# Patient Record
Sex: Female | Born: 1950 | Race: White | Hispanic: No | Marital: Married | Smoking: Never smoker
Health system: Southern US, Community
[De-identification: ages and names within clinical notes are randomized; demographics above are authoritative.]

## PROBLEM LIST (undated history)

## (undated) DIAGNOSIS — E785 Hyperlipidemia, unspecified: Secondary | ICD-10-CM

## (undated) DIAGNOSIS — E039 Hypothyroidism, unspecified: Secondary | ICD-10-CM

## (undated) HISTORY — DX: Hyperlipidemia, unspecified: E78.5

## (undated) HISTORY — PX: APPENDECTOMY: SHX54

## (undated) HISTORY — DX: Hypothyroidism, unspecified: E03.9

## (undated) HISTORY — PX: ABDOMINAL HYSTERECTOMY: SHX81

---

## 1984-10-31 HISTORY — PX: TMJ ARTHROPLASTY: SHX1066

## 1998-10-31 HISTORY — PX: CYSTOCELE REPAIR: SHX163

## 2011-07-15 ENCOUNTER — Ambulatory Visit (INDEPENDENT_AMBULATORY_CARE_PROVIDER_SITE_OTHER): Payer: BC Managed Care – PPO | Admitting: Internal Medicine

## 2011-07-15 ENCOUNTER — Encounter: Payer: Self-pay | Admitting: Internal Medicine

## 2011-07-15 DIAGNOSIS — E039 Hypothyroidism, unspecified: Secondary | ICD-10-CM | POA: Insufficient documentation

## 2011-07-15 DIAGNOSIS — E785 Hyperlipidemia, unspecified: Secondary | ICD-10-CM | POA: Insufficient documentation

## 2011-07-15 LAB — CBC WITH DIFFERENTIAL/PLATELET
Basophils Absolute: 0 10*3/uL (ref 0.0–0.1)
Eosinophils Absolute: 0.1 10*3/uL (ref 0.0–0.7)
Lymphocytes Relative: 43.2 % (ref 12.0–46.0)
MCHC: 32.6 g/dL (ref 30.0–36.0)
Monocytes Relative: 6.7 % (ref 3.0–12.0)
Neutro Abs: 3.1 10*3/uL (ref 1.4–7.7)
Platelets: 223 10*3/uL (ref 150.0–400.0)
RDW: 16.6 % — ABNORMAL HIGH (ref 11.5–14.6)

## 2011-07-15 LAB — LDL CHOLESTEROL, DIRECT: Direct LDL: 161 mg/dL

## 2011-07-15 LAB — LIPID PANEL
Cholesterol: 235 mg/dL — ABNORMAL HIGH (ref 0–200)
HDL: 52.8 mg/dL (ref >39.00)
Total CHOL/HDL Ratio: 4
Triglycerides: 165 mg/dL — ABNORMAL HIGH (ref 0.0–149.0)
VLDL: 33 mg/dL (ref 0.0–40.0)

## 2011-07-15 LAB — BASIC METABOLIC PANEL
CO2: 23 mEq/L (ref 19–32)
Calcium: 9.2 mg/dL (ref 8.4–10.5)
Chloride: 106 mEq/L (ref 96–112)
Creatinine, Ser: 0.9 mg/dL (ref 0.4–1.2)
Sodium: 139 mEq/L (ref 135–145)

## 2011-07-15 LAB — TSH: TSH: 11.54 u[IU]/mL — ABNORMAL HIGH (ref 0.35–5.50)

## 2011-07-15 NOTE — Assessment & Plan Note (Signed)
She took statins at some point in the past, no side effect but according to the patient they didn't work. Extensive discussion about diet and exercise. Labs

## 2011-07-15 NOTE — Progress Notes (Signed)
  Subjective:    Patient ID: Jodi Lynch, female    DOB: 06-22-1951, 60 y.o.   MRN: [id number]  HPI New patient, here to get established. Her husband is one of my new patients as well ,Jodi  Lynch. In general doing well  Past Medical History  Diagnosis Date  . Hyperlipidemia     used to be on a statin   . Hypothyroid    Past Surgical History  Procedure Date  . Tmj arthroplasty 1986  . Cystocele repair 2000  . Abdominal hysterectomy     no oophorectomy  . Appendectomy    History   Social History  . Marital Status: Married    Spouse Name: N/A    Number of Children: N/A  . Years of Education: N/A   Occupational History  . 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 Smoking status: Never Smoker   . Smokeless tobacco: Never Used  . Alcohol Use: No  . Drug Use: No  . Sexually Active: Not on file   Other Topics Concern  . Not on file   Social History Narrative   The patient is married. Moved to [facility] 2011. Her children still live up [place]. 2 children, 2 children from her current husband, 90 G-children    Family History  Problem Relation Age of Onset  . Cancer Mother     breast  . Arthritis Father   . Cancer Father     lung  . Hyperlipidemia Father   . Hypertension Father   . Diabetes Maternal Aunt   . Stroke Maternal Grandfather   . Hypertension Maternal Grandfather     Review of Systems Complain of mild fatigue, she believes is due to her weight. 2 years ago she did a low carbohydrate diet and lost 23 pounds, has not gained them back but is unable to keep losing. Denies any chest pain or lower extremity edema occ short of breath when she goes up the stairs, this symptom has been stable over time.     Objective:   Physical Exam  Constitutional: She is oriented to person, place, and time. She appears well-developed.       Overweight appearing  HENT:  Head: Normocephalic and atraumatic.  Neck: No thyromegaly  present.  Cardiovascular: Normal rate, regular rhythm and normal heart sounds.   No murmur heard. Pulmonary/Chest: Effort normal and breath sounds normal. No respiratory distress. She has no wheezes. She has no rales.  Musculoskeletal: She exhibits no edema.  Neurological: She is alert and oriented to person, place, and time.  Psychiatric: She has a normal mood and affect. Her behavior is normal. Judgment and thought content normal.          Assessment & Plan:

## 2011-07-15 NOTE — Assessment & Plan Note (Signed)
Used to take Synthroid in the past, has not taken in a while.labs

## 2011-07-18 NOTE — Progress Notes (Signed)
Quick Note:  Left a message for pt to return call. ______ 

## 2011-07-20 ENCOUNTER — Telehealth: Payer: Self-pay | Admitting: Internal Medicine

## 2011-07-20 MED ORDER — LEVOTHYROXINE SODIUM 50 MCG PO TABS: 50.0000 ug | ORAL_TABLET | Freq: Every day | ORAL | Status: DC

## 2011-07-20 NOTE — Telephone Encounter (Signed)
Please call Synthroid prescription in to the [facility] on [address].

## 2011-07-20 NOTE — Telephone Encounter (Signed)
Sent synthroid to [facility] pt already aware.Marland KitchenMarland Kitchen9/19/12@3 :56pm/LMB

## 2011-08-31 ENCOUNTER — Other Ambulatory Visit: Payer: Self-pay | Admitting: Internal Medicine

## 2011-08-31 DIAGNOSIS — E039 Hypothyroidism, unspecified: Secondary | ICD-10-CM

## 2011-09-01 ENCOUNTER — Other Ambulatory Visit (INDEPENDENT_AMBULATORY_CARE_PROVIDER_SITE_OTHER): Payer: BC Managed Care – PPO

## 2011-09-01 ENCOUNTER — Telehealth: Payer: Self-pay

## 2011-09-01 DIAGNOSIS — E039 Hypothyroidism, unspecified: Secondary | ICD-10-CM

## 2011-09-01 MED ORDER — LEVOTHYROXINE SODIUM 112 MCG PO TABS
112.0000 ug | ORAL_TABLET | Freq: Every day | ORAL | Status: DC
Start: 2011-09-01 — End: 2012-01-06

## 2011-09-01 NOTE — Telephone Encounter (Signed)
Advised patient, needs more Synthroid, increased from 50 mcg to 112 mcg one tablet daily. TSH in 6 weeks dx--hypothyroidism Sending new Rx to pharmacy listed in chart.

## 2011-09-01 NOTE — Progress Notes (Signed)
LABS ONLY  

## 2011-09-01 NOTE — Telephone Encounter (Signed)
Message copied by Francisco Capuchin on Thu Sep 01, 2011  3:18 PM ------      Message from: Willow Ora E      Created: Thu Sep 01, 2011  2:03 PM       Advised patient, needs more Synthroid, increased from 50 mcg to 112 mcg one tablet daily. TSH in 6 weeks dx--hypothyroidism      Send a new Rx

## 2011-09-01 NOTE — Telephone Encounter (Signed)
Message copied by Francisco Capuchin on Thu Sep 01, 2011  3:16 PM ------      Message from: Jodi Lynch      Created: Thu Sep 01, 2011  2:03 PM       Advised patient, needs more Synthroid, increased from 50 mcg to 112 mcg one tablet daily. TSH in 6 weeks dx--hypothyroidism      Send a new Rx

## 2011-09-01 NOTE — Telephone Encounter (Signed)
Message copied by Francisco Capuchin on Thu Sep 01, 2011  3:35 PM ------      Message from: Willow Ora E      Created: Thu Sep 01, 2011  2:03 PM       Advised patient, needs more Synthroid, increased from 50 mcg to 112 mcg one tablet daily. TSH in 6 weeks dx--hypothyroidism      Send a new Rx

## 2011-09-01 NOTE — Telephone Encounter (Signed)
Message copied by Francisco Capuchin on Thu Sep 01, 2011  3:17 PM ------      Message from: Willow Ora E      Created: Thu Sep 01, 2011  2:03 PM       Advised patient, needs more Synthroid, increased from 50 mcg to 112 mcg one tablet daily. TSH in 6 weeks dx--hypothyroidism      Send a new Rx

## 2011-10-23 ENCOUNTER — Telehealth: Payer: Self-pay | Admitting: Internal Medicine

## 2011-10-23 NOTE — Telephone Encounter (Signed)
Needs TSH, please arrange

## 2011-10-26 NOTE — Telephone Encounter (Signed)
msg left to call the office     KP 

## 2011-10-27 NOTE — Telephone Encounter (Signed)
msg left to call the office.     Letter mailed to schedule a lab apt     KP

## 2011-11-04 ENCOUNTER — Other Ambulatory Visit (INDEPENDENT_AMBULATORY_CARE_PROVIDER_SITE_OTHER): Payer: BC Managed Care – PPO

## 2011-11-04 DIAGNOSIS — E039 Hypothyroidism, unspecified: Secondary | ICD-10-CM

## 2011-11-04 LAB — TSH: TSH: 0.59 u[IU]/mL (ref 0.35–5.50)

## 2012-01-06 ENCOUNTER — Other Ambulatory Visit: Payer: Self-pay | Admitting: Internal Medicine

## 2012-01-06 NOTE — Telephone Encounter (Signed)
Prescription sent to pharmacy.

## 2012-01-11 ENCOUNTER — Ambulatory Visit (INDEPENDENT_AMBULATORY_CARE_PROVIDER_SITE_OTHER): Payer: BC Managed Care – PPO | Admitting: Internal Medicine

## 2012-01-11 ENCOUNTER — Encounter: Payer: Self-pay | Admitting: Internal Medicine

## 2012-01-11 DIAGNOSIS — Z23 Encounter for immunization: Secondary | ICD-10-CM

## 2012-01-11 DIAGNOSIS — Z2911 Encounter for prophylactic immunotherapy for respiratory syncytial virus (RSV): Secondary | ICD-10-CM

## 2012-01-11 DIAGNOSIS — Z0000 Encounter for general adult medical examination without abnormal findings: Secondary | ICD-10-CM

## 2012-01-11 LAB — LIPID PANEL
Cholesterol: 210 mg/dL — ABNORMAL HIGH (ref 0–200)
HDL: 50.3 mg/dL (ref >39.00)
Triglycerides: 157 mg/dL — ABNORMAL HIGH (ref 0.0–149.0)
VLDL: 31.4 mg/dL (ref 0.0–40.0)

## 2012-01-11 LAB — HEPATIC FUNCTION PANEL
ALT: 24 U/L (ref 0–35)
AST: 23 U/L (ref 0–37)
Alkaline Phosphatase: 88 U/L (ref 39–117)
Total Bilirubin: 0 mg/dL — ABNORMAL LOW (ref 0.3–1.2)

## 2012-01-11 NOTE — Assessment & Plan Note (Addendum)
Tdap 2008 pneumonia shot 2010 zostavax today Never had a colonoscopy, pros and calls of the colonoscopy versus Hemoccults discussed. Elected a colonoscopy. Referral will be done. She is doing great with diet and exercise Labs & EKG (sinus brady, no acute changes, no old ekgs) Has not had a Pap smear since her 30s when she had a hysterectomy. Referred to gyn. Bone density test  ~ 2008

## 2012-01-11 NOTE — Progress Notes (Signed)
  Subjective:    Patient ID: Jodi Lynch, female    DOB: [birth year], 61 y.o.   MRN: [id number]  HPI Complete physical exam  .pmg Past Surgical History  Procedure Date  . Tmj arthroplasty 1986  . Cystocele repair 2000  . Abdominal hysterectomy     no oophorectomy  . Appendectomy     . Family History  Problem Relation Age of Onset  . Cancer Mother     breast  . Arthritis Father   . Cancer Father     lung  . Hyperlipidemia Father   . Hypertension Father   . Diabetes Maternal Aunt   . Stroke Maternal Grandfather   . Hypertension Maternal Grandfather    History   Social History  . Marital Status: Married    Spouse Name: N/A    Number of Children: N/A  . Years of Education: N/A   Occupational History  . 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 Smoking status: Never Smoker   . Smokeless tobacco: Never Used  . Alcohol Use: No  . Drug Use: No  . Sexually Active: Not on file   Other Topics Concern  . Not on file   Social History Narrative   The patient is married. Moved to [facility] 2011. Her children still live up [place]. 2 children, 2 children from her current husband, 43 G-children     Review of Systems In general feels well. In January she started a nutritional program, doing better,  she is under the care of a nutritionist at work. She also started to go to the gym at least 3 times a week, she has a trainer, initially has some dyspnea on exertion but that is getting slightly better. No chest pain. Denies nausea, vomiting, diarrhea. No blood in the stools. No anxiety or depression.     Objective:   Physical Exam  Constitutional: She is oriented to person, place, and time. She appears well-developed and well-nourished. No distress.  HENT:  Head: Normocephalic and atraumatic.  Neck: No thyromegaly present.  Cardiovascular: Normal rate, regular rhythm and normal heart sounds.   No murmur heard. Pulmonary/Chest: Effort  normal and breath sounds normal. No respiratory distress. She has no wheezes. She has no rales.  Abdominal: Soft. She exhibits no distension. There is no tenderness. There is no rebound and no guarding.  Musculoskeletal: She exhibits no edema.  Neurological: She is alert and oriented to person, place, and time.  Skin: Skin is warm and dry. She is not diaphoretic.  Psychiatric: She has a normal mood and affect. Her behavior is normal. Judgment and thought content normal.      Assessment & Plan:

## 2012-01-12 ENCOUNTER — Encounter: Payer: Self-pay | Admitting: Internal Medicine

## 2012-01-16 ENCOUNTER — Encounter: Payer: Self-pay | Admitting: Gastroenterology

## 2012-01-30 ENCOUNTER — Telehealth: Payer: Self-pay | Admitting: *Deleted

## 2012-01-30 NOTE — Telephone Encounter (Signed)
She just needs a TSH checked ~02-2012 , please schedule

## 2012-01-30 NOTE — Telephone Encounter (Signed)
Pt called to advise that per her husbands upcoming surgery she will not be able to keep her upcoming apt with MD Drue Novel, and will call back later to re-schedule them, noted no upcoming apt noted, please advise

## 2012-01-30 NOTE — Telephone Encounter (Signed)
Please call to schedule per PAZ note prior, thanks

## 2012-01-31 NOTE — Telephone Encounter (Signed)
Called [facility]

## 2012-02-16 ENCOUNTER — Emergency Department (INDEPENDENT_AMBULATORY_CARE_PROVIDER_SITE_OTHER): Payer: BC Managed Care – PPO

## 2012-02-16 ENCOUNTER — Emergency Department (HOSPITAL_BASED_OUTPATIENT_CLINIC_OR_DEPARTMENT_OTHER)
Admission: EM | Admit: 2012-02-16 | Discharge: 2012-02-16 | Disposition: A | Discharge status: Home / Self Care | Payer: BC Managed Care – PPO | Attending: Emergency Medicine | Admitting: Emergency Medicine

## 2012-02-16 ENCOUNTER — Encounter (HOSPITAL_BASED_OUTPATIENT_CLINIC_OR_DEPARTMENT_OTHER): Payer: Self-pay | Admitting: *Deleted

## 2012-02-16 ENCOUNTER — Telehealth: Payer: Self-pay | Admitting: Internal Medicine

## 2012-02-16 DIAGNOSIS — X58XXXA Exposure to other specified factors, initial encounter: Secondary | ICD-10-CM

## 2012-02-16 DIAGNOSIS — S92109A Unspecified fracture of unspecified talus, initial encounter for closed fracture: Secondary | ICD-10-CM

## 2012-02-16 DIAGNOSIS — M25579 Pain in unspecified ankle and joints of unspecified foot: Secondary | ICD-10-CM | POA: Insufficient documentation

## 2012-02-16 DIAGNOSIS — R609 Edema, unspecified: Secondary | ICD-10-CM | POA: Insufficient documentation

## 2012-02-16 DIAGNOSIS — S92102A Unspecified fracture of left talus, initial encounter for closed fracture: Secondary | ICD-10-CM

## 2012-02-16 DIAGNOSIS — W19XXXA Unspecified fall, initial encounter: Secondary | ICD-10-CM | POA: Insufficient documentation

## 2012-02-16 NOTE — Discharge Instructions (Signed)
Foot Fracture Your caregiver has diagnosed you as having a foot fracture (broken bone). Your foot has many bones. You have a fracture, or break, in one of these bones. In some cases, your doctor may put on a splint or removable fracture boot until the swelling in your foot has lessened. A cast may or may not be required. HOME CARE INSTRUCTIONS  If you do not have a cast or splint:  You may bear weight on your injured foot as tolerated or advised.   Do not put any weight on your injured foot for as long as directed by your caregiver. Slowly increase the amount of time you walk on the foot as the pain and swelling allows or as advised.   Use crutches until you can bear weight without pain. A gradual increase in weight bearing may help.   Apply ice to the injury for 15 to 20 minutes each hour while awake for the first 2 days. Put the ice in a plastic bag and place a towel between the bag of ice and your skin.   If an ace bandage (stretchy, elastic wrapping bandage) was applied, you may re-wrap it if ankle is more painful or your toes become cold and swollen.  If you have a cast or splint:  Use your crutches for as long as directed by your caregiver.   To lessen the swelling, keep the injured foot elevated on pillows while lying down or sitting. Elevate your foot above your heart.   Apply ice to the injury for 15 to 20 minutes each hour while awake for the first 2 days. Put the ice in a plastic bag and place a thin towel between the bag of ice and your cast.   Plaster or fiberglass cast:   Do not try to scratch the skin under the cast using a sharp or pointed object down the cast.   Check the skin around the cast every day. You may put lotion on any red or sore areas.   Keep your cast clean and dry.   Plaster splint:   Wear the splint until you are seen for a follow-up examination.   You may loosen the elastic around the splint if your toes become numb, tingle, or turn blue or cold. Do  not rest it on anything harder than a pillow in the first 24 hours.   Do not put pressure on any part of your splint. Use your crutches as directed.   Keep your splint dry. It can be protected during bathing with a plastic bag. Do not lower the splint into water.   If you have a fracture boot you may remove it to shower. Bear weight only as instructed by your caregiver.   Only take over-the-counter or prescription medicines for pain, discomfort, or fever as directed by your caregiver.  SEEK IMMEDIATE MEDICAL CARE IF:   Your cast gets damaged or breaks.   You have continued severe pain or more swelling than you did before the cast was put on.   Your skin or nails of your casted foot turn blue, gray, feel cold or numb.   There is a bad smell from your cast.   There is severe pain with movement of your toes.   There are new stains and/or drainage coming from under the cast.  MAKE SURE YOU:   Understand these instructions.   Will watch your condition.   Will get help right away if you are not doing well or get   worse.  Document Released: 10/14/2000 Document Revised: 10/06/2011 Document Reviewed: 11/20/2008 Exit[employer] Patient Information 2012 [employer]. 

## 2012-02-16 NOTE — ED Provider Notes (Signed)
History     CSN: [id number]  Arrival date & time 02/16/12  1620   First MD Initiated Contact with Patient 02/16/12 1656      Chief Complaint  Patient presents with  . Ankle Injury    (Consider location/radiation/quality/duration/timing/severity/associated sxs/prior treatment) Patient is a 61 y.o. female presenting with ankle pain. The history is provided by the patient. No language interpreter was used.  Ankle Pain  Incident onset: 6 days ago. The incident occurred in the street. The injury mechanism was torsion and a fall. The pain is present in the left ankle. The quality of the pain is described as aching. The pain is at a severity of 4/10. The pain is moderate. The pain has been constant since onset. Associated symptoms include numbness. Pertinent negatives include no loss of motion. She reports no foreign bodies present. The symptoms are aggravated by nothing. She has tried nothing for the symptoms.  Pt reports she had an xray in [place] that was negative.  Pt complains of continued pain.    Past Medical History  Diagnosis Date  . Hyperlipidemia     used to be on a statin   . Hypothyroid     Past Surgical History  Procedure Date  . Tmj arthroplasty 1986  . Cystocele repair 2000  . Abdominal hysterectomy     no oophorectomy  . Appendectomy     Family History  Problem Relation Age of Onset  . Cancer Mother     breast  . Arthritis Father   . Cancer Father     lung  . Hyperlipidemia Father   . Hypertension Father   . Diabetes Maternal Aunt   . Stroke Maternal Grandfather   . Hypertension Maternal Grandfather     History  Substance Use Topics  . Smoking status: Never Smoker   . Smokeless tobacco: Never Used  . Alcohol Use: No    OB History    Grav Para Term Preterm Abortions TAB SAB Ect Mult Living   2 2              Review of Systems  Musculoskeletal: Positive for myalgias and joint swelling.  Neurological: Positive for numbness.  All other systems  reviewed and are negative.    Allergies  Codeine  Home Medications   Current Outpatient Rx  Name Route Sig Dispense Refill  . IBUPROFEN 200 MG PO TABS Oral Take 600 mg by mouth every 6 (six) hours as needed. Patient used this medication for her foot pain.  She used the medication today at noon.    Marland Kitchen LEVOTHYROXINE SODIUM 112 MCG PO TABS  TAKE 1 TABLET (112 MCG TOTAL) BY MOUTH DAILY. 30 tablet 2  . ANALGESIC BALM 15-15 % EX OINT Topical Apply 1 application topically as needed. Patient used this medication for her ankle.    Marland Kitchen ONE-DAILY MULTI VITAMINS PO TABS Oral Take 1 tablet by mouth daily.      . TROLAMINE SALICYLATE 10 % EX CREA Topical Apply 1 application topically as needed. Patient used this medication for her ankle as well.      BP 165/88  Pulse 86  Temp(Src) 97.6 F (36.4 C) (Oral)  Resp 20  Ht 5\' 2"  (1.575 m)  Wt 197 lb (89.359 kg)  BMI 36.03 kg/m2  SpO2 97%  Physical Exam  Vitals reviewed. Constitutional: She is oriented to person, place, and time. She appears well-developed and well-nourished.  HENT:  Head: Normocephalic.  Musculoskeletal: She exhibits edema and tenderness.  Neurological: She is alert and oriented to person, place, and time.  Skin: Skin is warm and dry.  Psychiatric: She has a normal mood and affect.    ED Course  Procedures (including critical care time)  Labs Reviewed - No data to display No results found.   No diagnosis found.    MDM  Pt placed in a cam walker,   Pt advised to follow up with Dr. Pearletha Forge for recheck in 1 week.        Jodi Lynch, [place] 02/16/12 [phone]

## 2012-02-16 NOTE — ED Notes (Signed)
Pt reports that last Saturday pt turned her ankle over.  Pt was seen at urgent care in [place] and was told that she had a sprain to the left ankle.  Pt reports that it is still painful with swelling.

## 2012-02-16 NOTE — Telephone Encounter (Signed)
.  left message to have patient return my call.  

## 2012-02-16 NOTE — Telephone Encounter (Signed)
Called patient again, advised that I would appreciate a call from her to let me know if she was or was not going to have the TSH lab. Again left message on home phone

## 2012-02-16 NOTE — Telephone Encounter (Signed)
Review of appointment desk .Marland Kitchenappointment has not been scheduled.

## 2012-02-17 NOTE — Telephone Encounter (Signed)
Please note pt was called yesterday to schedule apt per PAZ, noted seen in ER yesterday

## 2012-02-18 NOTE — ED Provider Notes (Signed)
Medical screening examination/treatment/procedure(s) were performed by non-physician practitioner and as supervising physician I was immediately available for consultation/collaboration.  Hulen Mandler, MD 02/18/12 0741 

## 2012-02-20 ENCOUNTER — Encounter: Payer: Self-pay | Admitting: Family Medicine

## 2012-02-20 ENCOUNTER — Ambulatory Visit (HOSPITAL_BASED_OUTPATIENT_CLINIC_OR_DEPARTMENT_OTHER)
Admission: RE | Admit: 2012-02-20 | Discharge: 2012-02-20 | Disposition: A | Discharge status: Home / Self Care | Payer: BC Managed Care – PPO | Source: Ambulatory Visit | Attending: Family Medicine | Admitting: Family Medicine

## 2012-02-20 ENCOUNTER — Ambulatory Visit (INDEPENDENT_AMBULATORY_CARE_PROVIDER_SITE_OTHER): Payer: BC Managed Care – PPO | Admitting: Family Medicine

## 2012-02-20 VITALS — BP 124/80 | HR 65 | Temp 97.7°F | Ht 62.0 in | Wt 197.0 lb

## 2012-02-20 DIAGNOSIS — S99912A Unspecified injury of left ankle, initial encounter: Secondary | ICD-10-CM

## 2012-02-20 DIAGNOSIS — X58XXXA Exposure to other specified factors, initial encounter: Secondary | ICD-10-CM | POA: Insufficient documentation

## 2012-02-20 DIAGNOSIS — M19079 Primary osteoarthritis, unspecified ankle and foot: Secondary | ICD-10-CM

## 2012-02-20 DIAGNOSIS — S92109A Unspecified fracture of unspecified talus, initial encounter for closed fracture: Secondary | ICD-10-CM

## 2012-02-20 DIAGNOSIS — M949 Disorder of cartilage, unspecified: Secondary | ICD-10-CM

## 2012-02-20 DIAGNOSIS — S99929A Unspecified injury of unspecified foot, initial encounter: Secondary | ICD-10-CM

## 2012-02-21 ENCOUNTER — Encounter: Payer: Self-pay | Admitting: Family Medicine

## 2012-02-21 DIAGNOSIS — S92109A Unspecified fracture of unspecified talus, initial encounter for closed fracture: Secondary | ICD-10-CM | POA: Insufficient documentation

## 2012-02-21 NOTE — Assessment & Plan Note (Signed)
Left talus lateral process fracture - appears to be displaced on plain films.  CT of ankle performed to further assess degree of displacement and to look for associated fractures - displacement is 3.72mm by my measurement, fragment about 7mm in length.  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  Given the fragment is more than 2 mm displaced (albeit small in size), recommended she be nonweight bearing on crutches and we refer her to foot surgeon for further input.  Advised they may decide to trial conservative care and only move forward with operative intervention if she doesn't improve but will defer further management to them.  Crutches provided today.

## 2012-02-21 NOTE — Progress Notes (Signed)
Subjective:    Patient ID: Jodi Lynch, female    DOB: 01-03-1951, 61 y.o.   MRN: [id number]  PCP: Dr. Drue Novel  HPI 61 yo F here for left foot injury.  Patient reports on 4/13 she was in [place] when she stopped to avoid running into a small child - when doing so she thinks the likely inverted her left ankle causing abrupt injury to lateral aspect of her foot/ankle. She could walk the rest of that day but her ankle didn't feel right - has sprained ankle before and this feels differently. Pain, swelling worsened so she went to the ED and had x-rays showing a mildly displaced lateral process of talus fracture. Was placed in a walking boot and referred here. Boot has caused her to have more pain, more difficulty walking. Taking ibuprofen as needed. Did not bear weight on Saturday.  Past Medical History  Diagnosis Date  . Hyperlipidemia     used to be on a statin   . Hypothyroid     Current Outpatient Prescriptions on File Prior to Visit  Medication Sig Dispense Refill  . ibuprofen (ADVIL,MOTRIN) 200 MG tablet Take 600 mg by mouth every 6 (six) hours as needed. Patient used this medication for her foot pain.  She used the medication today at noon.      Marland Kitchen levothyroxine (SYNTHROID, LEVOTHROID) 112 MCG tablet TAKE 1 TABLET (112 MCG TOTAL) BY MOUTH DAILY.  30 tablet  2  . methyl salicylate-menthol (BENGAY) ointment Apply 1 application topically as needed. Patient used this medication for her ankle.      . Multiple Vitamin (MULTIVITAMIN) tablet Take 1 tablet by mouth daily.        Marland Kitchen trolamine salicylate (ASPERCREME) 10 % cream Apply 1 application topically as needed. Patient used this medication for her ankle as well.        Past Surgical History  Procedure Date  . Tmj arthroplasty 1986  . Cystocele repair 2000  . Abdominal hysterectomy     no oophorectomy  . Appendectomy     Allergies  Allergen Reactions  . Codeine Swelling    History   Social History  . Marital  Status: Married    Spouse Name: N/A    Number of Children: N/A  . Years of Education: N/A   Occupational History  . 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 Smoking status: Never Smoker   . Smokeless tobacco: Never Used  . Alcohol Use: No  . Drug Use: No  . Sexually Active: Not on file   Other Topics Concern  . Not on file   Social History Narrative   The patient is married. Moved to [facility] 2011. Her children still live up [place]. 2 children, 2 children from her current husband, 32 G-children     Family History  Problem Relation Age of Onset  . Cancer Mother     breast  . Hypertension Mother   . Arthritis Father   . Cancer Father     lung  . Hyperlipidemia Father   . Hypertension Father   . Diabetes Maternal Aunt   . Stroke Maternal Grandfather   . Hypertension Maternal Grandfather   . Heart attack Neg Hx   . Sudden death Neg Hx     BP 124/80  Pulse 65  Temp(Src) 97.7 F (36.5 C) (Oral)  Ht 5\' 2"  (1.575 m)  Wt 197 lb (89.359 kg)  BMI 36.03 kg/m2  Review  of Systems See HPI above.    Objective:   Physical Exam Gen: NAD  L foot/ankle: Mild swelling but no bruising lateral aspect of foot/ankle.  No erythema or other abnormalities. TTP about lateral talus just inferior to distal fibula.  No navicular, base 5th, malleolar, base 5th, other foot/ankle/lower leg tenderness. FROM with pain on external rotation. Did not test strength with known fracture. 1+ ant drawer with pain.  1+ talar tilt. NVI distally. Thompsons negative.     Assessment & Plan:  1. Left talus lateral process fracture - appears to be displaced on plain films.  CT of ankle performed to further assess degree of displacement and to look for associated fractures - displacement is 3.51mm by my measurement, fragment about 7mm in length.  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  Given the fragment is more than 2 mm displaced (albeit small in size  these are at higher risk of nonunion, persistent pain), recommended she be nonweight bearing on crutches and we refer her to foot surgeon for further input.  Advised they may decide to trial conservative care and only move forward with operative intervention if she doesn't improve but will defer further management to them.  Crutches provided today.

## 2012-02-23 ENCOUNTER — Ambulatory Visit: Payer: BC Managed Care – PPO | Admitting: Family Medicine

## 2012-02-24 ENCOUNTER — Encounter: Payer: BC Managed Care – PPO | Admitting: Gastroenterology

## 2012-02-28 NOTE — Telephone Encounter (Signed)
Mg left to call the office     KP 

## 2012-03-06 ENCOUNTER — Telehealth: Payer: Self-pay | Admitting: *Deleted

## 2012-03-06 DIAGNOSIS — E039 Hypothyroidism, unspecified: Secondary | ICD-10-CM

## 2012-03-06 NOTE — Telephone Encounter (Signed)
Per several attempts made to contact pt via telephone with no resolve, letter was mailed to pt home address to please call office to schedule a TSH lab visit per MD Paz   

## 2012-03-06 NOTE — Telephone Encounter (Signed)
Per several attempts made to contact pt via telephone with no resolve, letter was mailed to pt home address to please call office to schedule a TSH lab visit per MD Drue Novel

## 2012-04-04 ENCOUNTER — Other Ambulatory Visit: Payer: Self-pay | Admitting: Internal Medicine

## 2012-04-04 NOTE — Telephone Encounter (Signed)
Refill done.  

## 2012-04-05 NOTE — Telephone Encounter (Signed)
Addended by: Derry Lory A on: 04/05/2012 03:18 PM   Modules accepted: Orders

## 2012-04-05 NOTE — Telephone Encounter (Signed)
Future orders placed in chart for TSH per MD Paz.

## 2012-04-05 NOTE — Telephone Encounter (Signed)
Appointment has been made for 6.13 please put in an order

## 2012-04-12 ENCOUNTER — Other Ambulatory Visit (INDEPENDENT_AMBULATORY_CARE_PROVIDER_SITE_OTHER): Payer: BC Managed Care – PPO

## 2012-04-12 DIAGNOSIS — E039 Hypothyroidism, unspecified: Secondary | ICD-10-CM

## 2012-04-12 NOTE — Progress Notes (Signed)
Labs only

## 2012-04-16 ENCOUNTER — Encounter: Payer: Self-pay | Admitting: *Deleted

## 2012-07-13 ENCOUNTER — Other Ambulatory Visit: Payer: Self-pay | Admitting: Internal Medicine

## 2012-07-13 NOTE — Telephone Encounter (Signed)
refill Levothyroxine Sodium (Tab) 112 MCG TAKE 1 TABLET (112 MCG TOTAL) BY MOUTH DAILY. # 90 last fill 9.13.13(???)--per pharmacy  Last ov 4.22.13 ED F/U

## 2012-07-13 NOTE — Telephone Encounter (Signed)
Spoke with pharmacy. Pt still has a refill. Refill request sent in error.

## 2012-10-17 HISTORY — PX: ANKLE ARTHROSCOPY: SUR85

## 2012-10-29 ENCOUNTER — Other Ambulatory Visit: Payer: Self-pay | Admitting: Internal Medicine

## 2012-10-29 MED ORDER — LEVOTHYROXINE SODIUM 112 MCG PO TABS: 112.0000 ug | ORAL_TABLET | Freq: Every day | ORAL | Status: DC

## 2012-10-29 NOTE — Telephone Encounter (Signed)
Refill done.  

## 2012-10-29 NOTE — Telephone Encounter (Signed)
pt called stated she is out of Syhtroid, scheduled appt for 1.7.14 at 1:30 can she get a refill sent to [facility] Pt stated if needed she can come in early to do fasting labs she could Please call patient back at work and pt advised ok to leave a detailed message cb# [phone]

## 2012-11-06 ENCOUNTER — Encounter: Payer: Self-pay | Admitting: Internal Medicine

## 2012-11-06 ENCOUNTER — Ambulatory Visit (INDEPENDENT_AMBULATORY_CARE_PROVIDER_SITE_OTHER): Payer: BC Managed Care – PPO | Admitting: Internal Medicine

## 2012-11-06 VITALS — BP 140/84 | HR 67 | Temp 97.9°F | Wt 203.0 lb

## 2012-11-06 DIAGNOSIS — E039 Hypothyroidism, unspecified: Secondary | ICD-10-CM

## 2012-11-06 DIAGNOSIS — L989 Disorder of the skin and subcutaneous tissue, unspecified: Secondary | ICD-10-CM

## 2012-11-06 DIAGNOSIS — Z23 Encounter for immunization: Secondary | ICD-10-CM

## 2012-11-06 MED ORDER — LEVOTHYROXINE SODIUM 112 MCG PO TABS: 112.0000 ug | ORAL_TABLET | Freq: Every day | ORAL | Status: DC

## 2012-11-06 NOTE — Progress Notes (Signed)
  Subjective:    Patient ID: Jodi Lynch, female    DOB: 18-May-1951, 62 y.o.   MRN: [id number]  HPI Routine office visit Hypothyroidism, good medication compliance. Due for labs Status post a left ankle fracture, has not been well for the last few months up until December when she had surgery, since then she's improving. For the last few  of months has noted a skin lesions, scalp and  face, concern given her family history of SCC.   Past Medical History  Diagnosis Date  . Hyperlipidemia     used to be on a statin   . Hypothyroid    Past Surgical History  Procedure Date  . Tmj arthroplasty 1986  . Cystocele repair 2000  . Abdominal hysterectomy     no oophorectomy  . Appendectomy   . Ankle arthroscopy 10-17-12    Dr Lajoyce Corners    History   Social History  . Marital Status: Married    Spouse Name: N/A    Number of Children: N/A  . Years of Education: N/A   Occupational History  . 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 Smoking status: Never Smoker   . Smokeless tobacco: Never Used  . Alcohol Use: No  . Drug Use: No  . Sexually Active: Not on file   Other Topics Concern  . Not on file   Social History Narrative   The patient is married. Moved to [facility] 2011. Her children still live up [place]. 2 children, 2 children from her current husband, 64 G-children     Review of Systems Has been unable to exercise much due to to ankle fracture however she is getting better and plans to go back to exercise soon. Denies chest pain or shortness of breath     Objective:   Physical Exam  Constitutional: She appears well-developed and well-nourished. No distress.  HENT:  Head:    Skin: She is not diaphoretic.          Assessment & Plan:

## 2012-11-06 NOTE — Assessment & Plan Note (Signed)
Right temple lesion, SCC? Scaly, psoriatic-like lesion in the scalp. Plan: Dermatology referral

## 2012-11-06 NOTE — Assessment & Plan Note (Signed)
Good compliance with medication, due for a TSH.

## 2012-11-07 ENCOUNTER — Encounter: Payer: Self-pay | Admitting: *Deleted

## 2012-11-30 ENCOUNTER — Other Ambulatory Visit: Payer: Self-pay | Admitting: Internal Medicine

## 2012-11-30 NOTE — Telephone Encounter (Signed)
Spoke to Jodi Lynch at pharmacy, he confirmed they did receive the refill we sent in for the pt on 1.7.14.  Refill request sent in error.

## 2013-01-11 ENCOUNTER — Other Ambulatory Visit: Payer: Self-pay | Admitting: Internal Medicine

## 2013-02-18 ENCOUNTER — Encounter: Payer: Self-pay | Admitting: Internal Medicine

## 2013-02-18 ENCOUNTER — Ambulatory Visit (INDEPENDENT_AMBULATORY_CARE_PROVIDER_SITE_OTHER): Payer: BC Managed Care – PPO | Admitting: Internal Medicine

## 2013-02-18 VITALS — BP 128/74 | HR 69 | Temp 98.0°F | Ht 63.0 in | Wt 206.0 lb

## 2013-02-18 DIAGNOSIS — L989 Disorder of the skin and subcutaneous tissue, unspecified: Secondary | ICD-10-CM

## 2013-02-18 DIAGNOSIS — Z0000 Encounter for general adult medical examination without abnormal findings: Secondary | ICD-10-CM

## 2013-02-18 NOTE — Assessment & Plan Note (Signed)
Went to see Dr. 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 At some point would like to see a different dermatologist for a general skin checkup.

## 2013-02-18 NOTE — Assessment & Plan Note (Addendum)
Tdap 2008 pneumonia shot 2010 zostavax 3-13 Never had a colonoscopy, was referred to GI last year but couldn't go (father was sick), lost a friend due to complications of a cscope and now is afraid to proceed; discussed benefits of Cscope and the low risk if complications in most cases ; we agreed on doing an iFOB diet ok and exercise improving since she had ankle surgery Has not had a Pap smear since her 30s when she had a hysterectomy. Referred to gyn last year , couldn't go ---> pt will call them and  reschedule. Bone density test  ~ 2008, was told ok, repeat DEXA. Generalized aches and pains, likely DJD, will check a set rate, if elevated, will consider check ANA and rheumatoid factor

## 2013-02-18 NOTE — Patient Instructions (Addendum)
Please call Dr Tresa Endo A. Fogleman office an schedule a check up --- Come back fasting this week for labs: FLP CMP CBC TSH sed rate------ dx v70.00 --- Next visit 1 year and as needed

## 2013-02-18 NOTE — Progress Notes (Signed)
  Subjective:    Patient ID: Jodi Lynch, female    DOB: Sep 06, 1951, 62 y.o.   MRN: [id number]  HPI Complete physical exam  Past Medical History  Diagnosis Date  . Hyperlipidemia     used to be on a statin   . Hypothyroid    Past Surgical History  Procedure Laterality Date  . Tmj arthroplasty  1986  . Cystocele repair  2000    mesh implant,+ ongoing urinary symptoms  . Abdominal hysterectomy      no oophorectomy  . Appendectomy    . Ankle arthroscopy Left 10-17-12    Dr Lajoyce Corners    History   Social History  . Marital Status: Married    Spouse Name: N/A    Number of Children: 2  . Years of Education: N/A   Occupational History  . 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     Social History Main Topics  . Smoking status: Never Smoker   . Smokeless tobacco: Never Used  . Alcohol Use: Yes     Comment: rarely  . Drug Use: No  . Sexually Active: Not on file   Other Topics Concern  . Not on file   Social History Narrative   The patient is married. Moved to [facility] 2011. Her children still live up [place]. 2 children, 2 children from her current husband, 20 G-children    Family History  Problem Relation Age of Onset  . Breast cancer Mother 49  . Hypertension Mother   . Arthritis Father   . Alzheimer's disease Father   . Hyperlipidemia Father   . Hypertension Father   . Diabetes Maternal Aunt   . Stroke Maternal Grandfather   . Heart attack Neg Hx   . Sudden death Neg Hx   . Colon cancer Neg Hx      Review of Systems In general doing well. More active since she had ankle surgery and the pain is decreased. C/o of generalized aches in the joints, has not seen any joint swelling or redness per se. Tried glucosamine but couldn't tolerate it d/t nausea and stomach pain. Denies fever, chills, weight loss or a rash. Also denies chest pain, shortness of breath. No diarrhea or blood in the stools. No vaginal discharge or vaginal bleeding. She has ongoing  urinary frequency, see past surgical history    Objective:   Physical Exam General -- alert, well-developed, No apparent distress Neck --no thyromegaly Lungs -- normal respiratory effort, no intercostal retractions, no accessory muscle use, and normal breath sounds.   Heart-- normal rate, regular rhythm, no murmur, and no gallop.   Abdomen--soft, non-tender, no distention, no masses, no HSM, no guarding, and no rigidity.   Extremities--  +/+++ pretibial edema bilaterally Inspection on palpation of the elbows, wrists and hands normal, no synovitis  Neurologic-- alert & oriented X3 and strength normal in all extremities. Psych-- Cognition and judgment appear intact. Alert and cooperative with normal attention span and concentration.  not anxious appearing and not depressed appearing.       Assessment & Plan:

## 2013-02-19 ENCOUNTER — Other Ambulatory Visit (INDEPENDENT_AMBULATORY_CARE_PROVIDER_SITE_OTHER): Payer: BC Managed Care – PPO

## 2013-02-19 DIAGNOSIS — E039 Hypothyroidism, unspecified: Secondary | ICD-10-CM

## 2013-02-19 DIAGNOSIS — Z0000 Encounter for general adult medical examination without abnormal findings: Secondary | ICD-10-CM

## 2013-02-19 LAB — COMPREHENSIVE METABOLIC PANEL
AST: 17 U/L (ref 0–37)
Albumin: 3.6 g/dL (ref 3.5–5.2)
Alkaline Phosphatase: 92 U/L (ref 39–117)
Potassium: 4.6 mEq/L (ref 3.5–5.1)
Sodium: 137 mEq/L (ref 135–145)
Total Protein: 6.9 g/dL (ref 6.0–8.3)

## 2013-02-19 LAB — CBC WITH DIFFERENTIAL/PLATELET
Basophils Relative: 0.8 % (ref 0.0–3.0)
Eosinophils Absolute: 0.1 10*3/uL (ref 0.0–0.7)
MCHC: 32.8 g/dL (ref 30.0–36.0)
MCV: 82.2 fl (ref 78.0–100.0)
Monocytes Absolute: 0.4 10*3/uL (ref 0.1–1.0)
Neutrophils Relative %: 51.7 % (ref 43.0–77.0)
Platelets: 193 10*3/uL (ref 150.0–400.0)
RBC: 4.54 Mil/uL (ref 3.87–5.11)

## 2013-02-19 LAB — LIPID PANEL
HDL: 44.1 mg/dL (ref >39.00)
VLDL: 28.8 mg/dL (ref 0.0–40.0)

## 2013-02-19 LAB — FECAL OCCULT BLOOD, IMMUNOCHEMICAL: Fecal Occult Bld: NEGATIVE

## 2013-02-21 ENCOUNTER — Ambulatory Visit: Payer: BC Managed Care – PPO

## 2013-02-21 ENCOUNTER — Encounter: Payer: Self-pay | Admitting: *Deleted

## 2013-02-21 DIAGNOSIS — E039 Hypothyroidism, unspecified: Secondary | ICD-10-CM

## 2013-02-22 MED ORDER — LEVOTHYROXINE SODIUM 150 MCG PO TABS: 150.0000 ug | ORAL_TABLET | Freq: Every day | ORAL | Status: DC

## 2013-03-21 ENCOUNTER — Ambulatory Visit (INDEPENDENT_AMBULATORY_CARE_PROVIDER_SITE_OTHER)
Admission: RE | Admit: 2013-03-21 | Discharge: 2013-03-21 | Disposition: A | Discharge status: Home / Self Care | Payer: BC Managed Care – PPO | Source: Ambulatory Visit | Attending: Internal Medicine | Admitting: Internal Medicine

## 2013-03-21 DIAGNOSIS — Z0000 Encounter for general adult medical examination without abnormal findings: Secondary | ICD-10-CM

## 2013-05-07 ENCOUNTER — Telehealth: Payer: Self-pay | Admitting: Internal Medicine

## 2013-05-07 DIAGNOSIS — E039 Hypothyroidism, unspecified: Secondary | ICD-10-CM

## 2013-05-07 NOTE — Telephone Encounter (Signed)
Last TSH wnl but could be better , please clarify synthroid dose and compliance, arrange a TSH in 1 month

## 2013-05-08 NOTE — Telephone Encounter (Signed)
Spoke with patient, she states she is taking the 150 mcg daily as scheduled. She states she is feeling much better since the adjusted dose and the eczema has improved as well. Follow up lab orders placed and appointment scheduled.

## 2013-05-08 NOTE — Telephone Encounter (Signed)
thx

## 2013-06-03 ENCOUNTER — Other Ambulatory Visit (INDEPENDENT_AMBULATORY_CARE_PROVIDER_SITE_OTHER): Payer: BC Managed Care – PPO

## 2013-06-03 DIAGNOSIS — E039 Hypothyroidism, unspecified: Secondary | ICD-10-CM

## 2013-06-05 ENCOUNTER — Telehealth: Payer: Self-pay | Admitting: *Deleted

## 2013-06-05 DIAGNOSIS — E039 Hypothyroidism, unspecified: Secondary | ICD-10-CM

## 2013-06-05 NOTE — Telephone Encounter (Signed)
Message copied by Shirlee More I on Wed Jun 05, 2013  4:46 PM ------      Message from: Willow Ora E      Created: Mon Jun 03, 2013  1:14 PM       Advise patient, thyroid is well balanced, continue with present care, arrange a TSH in 6 months from now ------

## 2013-06-05 NOTE — Telephone Encounter (Signed)
Discussed with patient, verbalized understanding. Orders entered. Pt. States she will call back to schedule labs.

## 2013-06-11 ENCOUNTER — Other Ambulatory Visit: Payer: Self-pay | Admitting: Internal Medicine

## 2013-06-11 NOTE — Telephone Encounter (Signed)
Refill done per protocol.  

## 2013-06-19 ENCOUNTER — Encounter: Payer: Self-pay | Admitting: Internal Medicine

## 2013-06-19 ENCOUNTER — Ambulatory Visit (INDEPENDENT_AMBULATORY_CARE_PROVIDER_SITE_OTHER): Payer: BC Managed Care – PPO | Admitting: Internal Medicine

## 2013-06-19 VITALS — BP 120/70 | HR 78 | Temp 98.4°F | Wt 203.0 lb

## 2013-06-19 DIAGNOSIS — M25561 Pain in right knee: Secondary | ICD-10-CM

## 2013-06-19 DIAGNOSIS — M25569 Pain in unspecified knee: Secondary | ICD-10-CM

## 2013-06-19 NOTE — Progress Notes (Signed)
  Subjective:    Patient ID: Jodi Lynch, female    DOB: December 23, 1950, 62 y.o.   MRN: [id number]  HPI  Acute visit Yesterday was walking at work and suddenly felt a pop in the back of the knee associated with intense pain in the area. The pain persists until now, she is taking Motrin and icing the area. Pain is worse when she puts pressure on that leg.  Past Medical History  Diagnosis Date  . Hyperlipidemia     used to be on a statin   . Hypothyroid    Past Surgical History  Procedure Laterality Date  . Tmj arthroplasty  1986  . Cystocele repair  2000    mesh implant,+ ongoing urinary symptoms  . Abdominal hysterectomy      no oophorectomy  . Appendectomy    . Ankle arthroscopy Left 10-17-12    Dr Lajoyce Corners       Review of Systems She took a trip to [place] last month, walk up hills for several hours, at that time developed some soreness in the legs bilaterally but that quickly resolved. No actual pain-swellin @ calf No fever chills No injury No recent airplane trip    Objective:   Physical Exam  Musculoskeletal:       Legs:   General -- alert, well-developed, Walks with some difficulty  Extremities--  no pretibial edema bilaterally , calves symmetric and not tender  L knee normal R knee : see graphic R foot-- warm, normal pedal pulse  Neurologic-- alert & oriented X3. Speech, gait normal.  Psych-- Cognition and judgment appear intact. Alert and cooperative with normal attention span and concentration. not anxious appearing and not depressed appearing.       Assessment & Plan:  Knee pain, Popliteal pain , acute onset, normal vascular exam, doubt DVT. Plan: Pain control w/ motrin, unable to tolerate vicodin, crutches To see ortho: ?Baker's cyst, ?posterior effusion

## 2013-06-19 NOTE — Patient Instructions (Addendum)
Ice, rest Motrin 200 mg 2 tablets every 6 hours as needed for pain. Always take it with food because may cause gastritis and ulcers. If you notice nausea, stomach pain, change in the color of stools --->  Stop the medicine and let us know If calf swell up or start hurting---- let us know  See Dr Roda Shutters at 2.45 (Dr Lajoyce Corners office)

## 2013-06-20 ENCOUNTER — Encounter: Payer: Self-pay | Admitting: Internal Medicine

## 2013-09-05 ENCOUNTER — Other Ambulatory Visit: Payer: Self-pay

## 2013-12-04 ENCOUNTER — Other Ambulatory Visit: Payer: Self-pay | Admitting: Internal Medicine

## 2013-12-04 NOTE — Telephone Encounter (Signed)
Levothyroxine refilled per protocol. JG//CMA 

## 2014-01-11 ENCOUNTER — Telehealth: Payer: Self-pay | Admitting: Internal Medicine

## 2014-01-11 NOTE — Telephone Encounter (Signed)
advise patient, due for a TSH, dx hypothyroidism Please arrange

## 2014-01-14 NOTE — Telephone Encounter (Signed)
Pt scheduled for 01/15/14 labs

## 2014-01-15 ENCOUNTER — Other Ambulatory Visit: Payer: BC Managed Care – PPO

## 2014-01-20 ENCOUNTER — Other Ambulatory Visit (INDEPENDENT_AMBULATORY_CARE_PROVIDER_SITE_OTHER): Payer: BC Managed Care – PPO

## 2014-01-20 DIAGNOSIS — E039 Hypothyroidism, unspecified: Secondary | ICD-10-CM

## 2014-01-20 LAB — TSH: TSH: 3.64 u[IU]/mL (ref 0.35–5.50)

## 2014-06-03 ENCOUNTER — Other Ambulatory Visit: Payer: Self-pay | Admitting: Internal Medicine

## 2014-09-01 ENCOUNTER — Encounter: Payer: Self-pay | Admitting: Internal Medicine

## 2015-02-21 ENCOUNTER — Other Ambulatory Visit: Payer: Self-pay | Admitting: Internal Medicine

## 2015-03-17 ENCOUNTER — Ambulatory Visit (INDEPENDENT_AMBULATORY_CARE_PROVIDER_SITE_OTHER): Payer: BLUE CROSS/BLUE SHIELD | Admitting: Internal Medicine

## 2015-03-17 ENCOUNTER — Ambulatory Visit (HOSPITAL_BASED_OUTPATIENT_CLINIC_OR_DEPARTMENT_OTHER)
Admission: RE | Admit: 2015-03-17 | Discharge: 2015-03-17 | Disposition: A | Discharge status: Home / Self Care | Payer: BLUE CROSS/BLUE SHIELD | Source: Ambulatory Visit | Attending: Internal Medicine | Admitting: Internal Medicine

## 2015-03-17 ENCOUNTER — Encounter: Payer: Self-pay | Admitting: Internal Medicine

## 2015-03-17 VITALS — BP 126/74 | HR 63 | Temp 97.7°F | Ht 63.0 in | Wt 207.1 lb

## 2015-03-17 DIAGNOSIS — E032 Hypothyroidism due to medicaments and other exogenous substances: Secondary | ICD-10-CM

## 2015-03-17 DIAGNOSIS — L309 Dermatitis, unspecified: Secondary | ICD-10-CM

## 2015-03-17 DIAGNOSIS — E785 Hyperlipidemia, unspecified: Secondary | ICD-10-CM

## 2015-03-17 DIAGNOSIS — R0609 Other forms of dyspnea: Secondary | ICD-10-CM | POA: Diagnosis not present

## 2015-03-17 MED ORDER — LEVOTHYROXINE SODIUM 150 MCG PO TABS: 150.0000 ug | ORAL_TABLET | Freq: Every day | ORAL | Status: DC

## 2015-03-17 NOTE — Patient Instructions (Signed)
please schedule labs to be done within few days (fasting)  Come back to the office in 3 months   for a routine check up    Call if symptoms increase or change

## 2015-03-17 NOTE — Progress Notes (Signed)
Subjective:    Patient ID: Jodi Lynch, female    DOB: 02/06/1951, [age] y.o.   MRN: [id number]  DOS:  03/17/2015 Type of visit - description : rov Interval history: Since 2014 the last time I saw her, she has been feeling well except for DOE. Noted dyspnea on exertion for the last 6-8 months, she is okay advise in flat surfaces  and able to walk up to half a mile at a slow pace but whenever she goes faster or up stairs or up a hill-- she gets SOB No associated nausea or diaphoresis. Hypothyroidism-- good compliance with Synthroid    Review of Systems No weight gain. Some edema mostly at the end of the day  no substernal chest pain that from time to time she feels chest tightness described as "unable to get a deep breath", but sx have been usually at rest, not associated with exertion. No palpitations, orthopnea. No nausea, vomiting, diarrhea or blood in the stools. No cough or sputum production.   Past Medical History  Diagnosis Date  . Hyperlipidemia     used to be on a statin   . Hypothyroid     Past Surgical History  Procedure Laterality Date  . Tmj arthroplasty  1986  . Cystocele repair  2000    mesh implant,+ ongoing urinary symptoms  . Abdominal hysterectomy      no oophorectomy  . Appendectomy    . Ankle arthroscopy Left 10-17-12    Dr Lajoyce Cornersuda     History   Social History  . Marital Status: Married    Spouse Name: N/A  . Number of Children: 2  . Years of Education: N/A   Occupational History  . [employer]    .     Social History Main Topics  . Smoking status: Never Smoker   . Smokeless tobacco: Never Used  . Alcohol Use: No     Comment: rarely  . Drug Use: No  . Sexual Activity: Not on file   Other Topics Concern  . Not on file   Social History Narrative   The patient is married. [place].    Her children still live up [place]. 2 children, 2 children from her current husband, 7311 G-children         Medication  List       This list is accurate as of: 03/17/15 11:59 PM.  Always use your most recent med list.               CALCIUM + D PO  Take by mouth.     levothyroxine 150 MCG tablet  Commonly known as:  SYNTHROID, LEVOTHROID  Take 1 tablet (150 mcg total) by mouth daily before breakfast.     multivitamin tablet  Take 1 tablet by mouth daily.           Objective:   Physical Exam BP 126/74 mmHg  Pulse 63  Temp(Src) 97.7 F (36.5 C) (Oral)  Ht 5\' 3"  (1.6 m)  Wt 207 lb 2 oz (93.951 kg)  BMI 36.70 kg/m2  SpO2 98% General:   Well developed, well nourished . NAD.  HEENT:  Normocephalic . Face symmetric, atraumatic Lungs:  CTA B Normal respiratory effort, no intercostal retractions, no accessory muscle use. Heart: RRR,  no murmur.  Trace  pretibial edema bilaterally  Abdomen:  Not distended, soft, non-tender. No rebound or rigidity. No mass,organomegaly Skin: Not pale. Not jaundice Neurologic:  alert & oriented  X3.  Speech normal, gait appropriate for age and unassisted Psych--  Cognition and judgment appear intact.  Cooperative with normal attention span and concentration.  Behavior appropriate. No anxious or depressed appearing.       Assessment & Plan:    Dyspnea on exertion, 64 year old lady with history of high cholesterol (although last LDL was pretty good without medications), nonsmoker, no family history of CAD, no hypertension presents with few months history of DOE. DDX includes deconditioning, CAD, others. EKG--sinus rhythm, low-voltage is otherwise normal. At baseline compared to previous EKGs Plan: Labs, chest x-ray, echo, stress test, ER if chest pain   Hypothyroidism,  good compliance with medications, check labs  Hyperlipidemia, Last LDL 134, on no meds, check labs.  Eczema In the past saw Dr. Terri PiedraLupton, she has some lesions frozen and was prescribed potent steroids, would like to be rechecked, would like to switch to another  dermatologist.  Follow-up in 3 months

## 2015-03-17 NOTE — Progress Notes (Signed)
Pre visit review using our clinic review tool, if applicable. No additional management support is needed unless otherwise documented below in the visit note. 

## 2015-03-20 ENCOUNTER — Other Ambulatory Visit: Payer: Self-pay

## 2015-03-20 ENCOUNTER — Other Ambulatory Visit (INDEPENDENT_AMBULATORY_CARE_PROVIDER_SITE_OTHER): Payer: BLUE CROSS/BLUE SHIELD

## 2015-03-20 DIAGNOSIS — E039 Hypothyroidism, unspecified: Secondary | ICD-10-CM

## 2015-03-20 DIAGNOSIS — E785 Hyperlipidemia, unspecified: Secondary | ICD-10-CM

## 2015-03-20 LAB — LIPID PANEL
CHOL/HDL RATIO: 4
Cholesterol: 206 mg/dL — ABNORMAL HIGH (ref 0–200)
HDL: 50.8 mg/dL (ref >39.00)
LDL CALC: 123 mg/dL — AB (ref 0–99)
NonHDL: 155.2
TRIGLYCERIDES: 162 mg/dL — AB (ref 0.0–149.0)
VLDL: 32.4 mg/dL (ref 0.0–40.0)

## 2015-03-20 LAB — TSH: TSH: 2.46 u[IU]/mL (ref 0.35–4.50)

## 2015-03-24 ENCOUNTER — Telehealth: Payer: Self-pay

## 2015-03-24 NOTE — Telephone Encounter (Signed)
CALLED DR PAZ OFFICE TO OBTAIN PRIOR AUTH I WAS TOLD / NO PAC RQD PER ANGELA OF [employer] . DAJ

## 2015-03-27 ENCOUNTER — Other Ambulatory Visit: Payer: Self-pay

## 2015-03-27 ENCOUNTER — Ambulatory Visit (HOSPITAL_COMMUNITY): Payer: BLUE CROSS/BLUE SHIELD | Attending: Cardiology

## 2015-03-27 DIAGNOSIS — R0609 Other forms of dyspnea: Secondary | ICD-10-CM | POA: Insufficient documentation

## 2015-03-27 DIAGNOSIS — E785 Hyperlipidemia, unspecified: Secondary | ICD-10-CM | POA: Diagnosis not present

## 2015-04-06 ENCOUNTER — Telehealth: Payer: Self-pay | Admitting: Internal Medicine

## 2015-04-06 NOTE — Telephone Encounter (Signed)
Caller name: Dnya SwazilandJordan Relationship to patient: self Can be reached: [phone] Pharmacy:  Reason for call: Pt called for lab results and echocardiogram results. She also wants to know if she still needs to do stress test.

## 2015-04-07 ENCOUNTER — Ambulatory Visit: Payer: Self-pay | Admitting: Internal Medicine

## 2015-04-07 NOTE — Telephone Encounter (Signed)
LMOM informing Pt of lab, echocardiogram results, informed her that they were released to her MyChart on 03/31/2015. Informed her that Dr. Drue NovelPaz recommends stress test if she is still experiencing symptoms, informed her if not that is fine but she is due for a routine F/U. Informed her to call if she has any further questions.

## 2015-04-07 NOTE — Telephone Encounter (Signed)
Pt would like to know if she still needs stress test done? She had an echocardiogram last month. Please advise.

## 2015-04-07 NOTE — Telephone Encounter (Signed)
We released the results of her blood work and echocardiogram [facility], they were normal.  As far as proceeding with a stress test, if she is feeling about the same I do recommend that.   If she is completely back to normal then we can hold the stress test but she will need a follow-up.

## 2015-05-11 ENCOUNTER — Telehealth: Payer: Self-pay | Admitting: Internal Medicine

## 2015-05-11 NOTE — Telephone Encounter (Signed)
Scheduled for 05/12/15 2:00pm

## 2015-05-11 NOTE — Telephone Encounter (Signed)
Please advise if you would like to see Pt first or okay to order mammogram.

## 2015-05-11 NOTE — Telephone Encounter (Signed)
Advise patient: A mammogram needs to be done along with a breast exam y the MD. Please schedule office visit for breast exam and a mammogram

## 2015-05-11 NOTE — Telephone Encounter (Signed)
Caller name: Judy Swazil[place]Jordan Relationship to patient: self Can be reached: [phone] (work ph#, best # to reach her)  Reason for call: Pt having some pain in right breast. She would like us to get [facility]. She prefers Early morning or late afternoon.

## 2015-05-11 NOTE — Telephone Encounter (Signed)
Dr. Drue NovelPaz would like Pt to be seen since she is having breast pain. Please have Pt schedule an acute visit with Dr. Drue NovelPaz or another provider. Thank you.

## 2015-05-12 ENCOUNTER — Ambulatory Visit (INDEPENDENT_AMBULATORY_CARE_PROVIDER_SITE_OTHER): Payer: BLUE CROSS/BLUE SHIELD | Admitting: Internal Medicine

## 2015-05-12 ENCOUNTER — Encounter: Payer: Self-pay | Admitting: Internal Medicine

## 2015-05-12 VITALS — BP 128/78 | HR 69 | Temp 97.8°F | Ht 63.0 in | Wt 212.2 lb

## 2015-05-12 DIAGNOSIS — R0609 Other forms of dyspnea: Secondary | ICD-10-CM

## 2015-05-12 DIAGNOSIS — Z1239 Encounter for other screening for malignant neoplasm of breast: Secondary | ICD-10-CM

## 2015-05-12 DIAGNOSIS — N644 Mastodynia: Secondary | ICD-10-CM

## 2015-05-12 NOTE — Patient Instructions (Signed)
Schedule a mammogram  

## 2015-05-12 NOTE — Progress Notes (Signed)
Pre visit review using our clinic review tool, if applicable. No additional management support is needed unless otherwise documented below in the visit note. 

## 2015-05-12 NOTE — Progress Notes (Signed)
   Subjective:    Patient ID: Jodi Lynch, female    DOB: 05/28/1951, 64 y.o.   MRN: [id number]  DOS:  05/12/2015 Type of visit - description : Acute Interval history: Having right breast pain for the last 3 days, located at the upper aspect of the breast. The pain is still there but decreased. No history of breast injury She denies fever chills. No rash Self breast exam is normal also was seen here with difficulty breathing, symptoms unchanged  Review of Systems See history of present illness  Past Medical History  Diagnosis Date  . Hyperlipidemia     used to be on a statin   . Hypothyroid     Past Surgical History  Procedure Laterality Date  . Tmj arthroplasty  1986  . Cystocele repair  2000    mesh implant,+ ongoing urinary symptoms  . Abdominal hysterectomy      no oophorectomy  . Appendectomy    . Ankle arthroscopy Left 10-17-12    Dr Lajoyce Cornersuda     History   Social History  . Marital Status: Married    Spouse Name: N/A  . Number of Children: 2  . Years of Education: N/A   Occupational History  . [employer]    .     Social History Main Topics  . Smoking status: Never Smoker   . Smokeless tobacco: Never Used  . Alcohol Use: No     Comment: rarely  . Drug Use: No  . Sexual Activity: Not on file   Other Topics Concern  . Not on file   Social History Narrative   The patient is married. [place].    Her children still live up [place]. 2 children, 2 children from her current husband, 3311 G-children         Medication List       This list is accurate as of: 05/12/15 11:59 PM.  Always use your most recent med list.               CALCIUM + D PO  Take by mouth.     levothyroxine 150 MCG tablet  Commonly known as:  SYNTHROID, LEVOTHROID  Take 1 tablet (150 mcg total) by mouth daily before breakfast.     multivitamin tablet  Take 1 tablet by mouth daily.           Objective:   Physical Exam BP 128/78 mmHg   Pulse 69  Temp(Src) 97.8 F (36.6 C) (Oral)  Ht 5\' 3"  (1.6 m)  Wt 212 lb 4 oz (96.276 kg)  BMI 37.61 kg/m2  SpO2 97%  General:   Well developed, well nourished . NAD.  HEENT:  Normocephalic . Face symmetric, atraumatic Breast: no dominant mass, skin and nipples normal to inspection on palpation, axillary areas without mass or lymphadenopathy Skin: Not pale. Not jaundice Neurologic:  alert & oriented X3.  Speech normal, gait appropriate for age and unassisted Psych--  Cognition and judgment appear intact.  Cooperative with normal attention span and concentration.  Behavior appropriate. No anxious or depressed appearing.       Assessment & Plan:  Mastalgia Self breast exam on my exam normal. Recommend mammogram  Dyspnea on exertion Symptoms are unchanged from the last visit, echocardiogram normal, she is hesitant to do a stress test, I recommend cardiology to see if she needs further eval. Likes to see Dr. Luciano CutterHochrain , her husband's cardiologist.

## 2015-05-13 ENCOUNTER — Encounter: Payer: Self-pay | Admitting: Internal Medicine

## 2015-05-19 ENCOUNTER — Ambulatory Visit (HOSPITAL_BASED_OUTPATIENT_CLINIC_OR_DEPARTMENT_OTHER)
Admission: RE | Admit: 2015-05-19 | Discharge: 2015-05-19 | Disposition: A | Discharge status: Home / Self Care | Payer: BLUE CROSS/BLUE SHIELD | Source: Ambulatory Visit | Attending: Internal Medicine | Admitting: Internal Medicine

## 2015-05-19 DIAGNOSIS — Z1231 Encounter for screening mammogram for malignant neoplasm of breast: Secondary | ICD-10-CM | POA: Diagnosis not present

## 2015-05-19 DIAGNOSIS — Z1239 Encounter for other screening for malignant neoplasm of breast: Secondary | ICD-10-CM

## 2015-05-19 LAB — HM MAMMOGRAPHY: HM Mammogram: NEGATIVE

## 2015-05-28 ENCOUNTER — Encounter: Payer: Self-pay | Admitting: *Deleted

## 2015-05-29 ENCOUNTER — Ambulatory Visit (INDEPENDENT_AMBULATORY_CARE_PROVIDER_SITE_OTHER): Payer: BLUE CROSS/BLUE SHIELD | Admitting: Internal Medicine

## 2015-05-29 ENCOUNTER — Encounter: Payer: Self-pay | Admitting: Internal Medicine

## 2015-05-29 VITALS — BP 142/78 | HR 88 | Ht 63.0 in | Wt 213.8 lb

## 2015-05-29 DIAGNOSIS — R0602 Shortness of breath: Secondary | ICD-10-CM | POA: Diagnosis not present

## 2015-05-29 LAB — CBC WITH DIFFERENTIAL/PLATELET
BASOS ABS: 0 10*3/uL (ref 0.0–0.1)
Basophils Relative: 0.5 % (ref 0.0–3.0)
EOS ABS: 0.2 10*3/uL (ref 0.0–0.7)
Eosinophils Relative: 2.4 % (ref 0.0–5.0)
HCT: 42.5 % (ref 36.0–46.0)
Hemoglobin: 14.1 g/dL (ref 12.0–15.0)
LYMPHS ABS: 3 10*3/uL (ref 0.7–4.0)
LYMPHS PCT: 36.3 % (ref 12.0–46.0)
MCHC: 33.3 g/dL (ref 30.0–36.0)
MCV: 82.8 fl (ref 78.0–100.0)
Monocytes Absolute: 0.6 10*3/uL (ref 0.1–1.0)
Monocytes Relative: 7.4 % (ref 3.0–12.0)
NEUTROS ABS: 4.5 10*3/uL (ref 1.4–7.7)
Neutrophils Relative %: 53.4 % (ref 43.0–77.0)
Platelets: 227 10*3/uL (ref 150.0–400.0)
RBC: 5.13 Mil/uL — AB (ref 3.87–5.11)
RDW: 15.7 % — AB (ref 11.5–15.5)
WBC: 8.4 10*3/uL (ref 4.0–10.5)

## 2015-05-29 LAB — BASIC METABOLIC PANEL
BUN: 12 mg/dL (ref 6–23)
CALCIUM: 9.7 mg/dL (ref 8.4–10.5)
CHLORIDE: 104 meq/L (ref 96–112)
CO2: 30 mEq/L (ref 19–32)
Creatinine, Ser: 0.9 mg/dL (ref 0.40–1.20)
GFR: 67.06 mL/min (ref >60.00)
GLUCOSE: 109 mg/dL — AB (ref 70–99)
POTASSIUM: 4 meq/L (ref 3.5–5.1)
SODIUM: 141 meq/L (ref 135–145)

## 2015-05-29 LAB — D-DIMER, QUANTITATIVE (NOT AT ARMC): D DIMER QUANT: 0.33 ug{FEU}/mL (ref 0.00–0.48)

## 2015-05-29 NOTE — Progress Notes (Addendum)
Cardiology Office Note   Date:  05/29/2015   ID:  Jabree Lynch, DOB December 30, 1950, MRN [id number]  PCP:  Willow Ora, MD  Cardiologist:   Dietrich Pates, MD   No chief complaint on file.  Patient referred for SOB   History of Present Illness: Jodi Lynch is a 64 y.o. female with a history of SOB   Works at [employer] that is long  Up 10 steps    Tight in chest  Cant walk on treadmill   6 months ago walking 5 miles per day with no issues NOw can't get a full enough breath when lays down   No wheezing     Echo done in May  Normal   Had cath years ago    Current Outpatient Prescriptions  Medication Sig Dispense Refill  . Calcium Carbonate-Vitamin D (CALCIUM + D PO) Take by mouth.    . levothyroxine (SYNTHROID, LEVOTHROID) 150 MCG tablet Take 1 tablet (150 mcg total) by mouth daily before breakfast. 30 tablet 6  . Multiple Vitamin (MULTIVITAMIN) tablet Take 1 tablet by mouth daily.       No current facility-administered medications for this visit.    Allergies:   Codeine   Past Medical History  Diagnosis Date  . Hyperlipidemia     used to be on a statin   . Hypothyroid     Past Surgical History  Procedure Laterality Date  . Tmj arthroplasty  1986  . Cystocele repair  2000    mesh implant,+ ongoing urinary symptoms  . Abdominal hysterectomy      no oophorectomy  . Appendectomy    . Ankle arthroscopy Left 10-17-12    Dr Lajoyce Corners      Social History:  The patient  reports that she has never smoked. She has never used smokeless tobacco. She reports that she does not drink alcohol or use illicit drugs.   Family History:  The patient's family history includes Alzheimer's disease in her father; Arthritis in her father; Breast cancer (age of onset: 43) in her mother; Diabetes in her maternal aunt; Hyperlipidemia in her father; Hypertension in her father and mother; Stroke in her maternal grandfather. There is no history of Heart attack, Sudden death, or Colon cancer.    ROS:   Please see the history of present illness. All other systems are reviewed and  Negative to the above problem except as noted.    PHYSICAL EXAM: VS:  BP 142/78 mmHg  Pulse 88  Ht  (1.6 m)  Wt 213 lb 12.8 oz (96.979 kg)  BMI 37.88 kg/m2  SpO2 98%  GEN: Well nourished, well developed, in no acute distress HEENT: normal Neck: no JVD, carotid bruits, or masses Cardiac: RRR; no murmurs, rubs, or gallops,no edema  Respiratory:  clear to auscultation bilaterally, normal work of breathing GI: soft, nontender, nondistended, + BS  No hepatomegaly  MS: no deformity Moving all extremities   Skin: warm and dry, no rash Neuro:  Strength and sensation are intact Psych: euthymic mood, full affect   EKG:  EKG is not ordered today.  On 5/17  SR  72 bpm    Lipid Panel    Component Value Date/Time   CHOL 206* 03/20/2015 0822   TRIG 162.0* 03/20/2015 0822   HDL 50.80 03/20/2015 0822   CHOLHDL 4 03/20/2015 0822   VLDL 32.4 03/20/2015 0822   LDLCALC 123* 03/20/2015 0822   LDLDIRECT 134.1 02/19/2013 0809      Wt Readings from Last  3 Encounters:  05/29/15 213 lb 12.8 oz (96.979 kg)  05/12/15 212 lb 4 oz (96.276 kg)  03/17/15 207 lb 2 oz (93.951 kg)      ASSESSMENT AND PLAN:  1.  Dyspnea.  Concerning.  She is moving air well  No wheezing.    Discussed options for evaluation with pt (myvoiew, CT angio, cath)  She would like to avoid cath.  Bad memory from last cath.  I am not sure how helpful myoview would be. Will check CBC to confimr she is not anemic    F/U based on test results     Signed, Dietrich Pates, MD  05/29/2015 2:48 PM    [facility] [address] [place], [place], [place]  [address] Phone: [phone]; Fax: [phone]

## 2015-05-29 NOTE — Patient Instructions (Addendum)
Medication Instructions:  Your physician recommends that you continue on your current medications as directed. Please refer to the Current Medication list given to you today.   Labwork: Your physician recommends that you return for lab work today: CBC/BMP/Ddimer     Testing/Procedures: None ordered  Follow-Up: Your physician recommends that you schedule a follow-up appointment will call with results of labs and plan   Any Other Special Instructions Will Be Listed Below (If Applicable).

## 2015-06-02 ENCOUNTER — Telehealth: Payer: Self-pay

## 2015-06-02 DIAGNOSIS — R0600 Dyspnea, unspecified: Secondary | ICD-10-CM

## 2015-06-02 NOTE — Telephone Encounter (Signed)
Left message for patient to call back. Order put in for cardiopulmonary stress test and will send [facility] message to schedule.

## 2015-06-02 NOTE — Telephone Encounter (Signed)
-----   Message from Pricilla Riffle, MD sent at 06/02/2015 11:40 AM EDT ----- Left msg on voicemail re blood work  Overall OK  Does not explain increased dyspnea. I would recomm cardiopulmonary stress test to evaluate  I left msg that someone in office would call back to schedule

## 2015-06-02 NOTE — Telephone Encounter (Signed)
Patient call back. Informed patient about Cardiopulmonary stress test and someone would be calling her to schedule. Patient verbalized understanding.

## 2015-06-11 ENCOUNTER — Ambulatory Visit (HOSPITAL_COMMUNITY): Payer: BLUE CROSS/BLUE SHIELD | Attending: Internal Medicine

## 2015-06-11 DIAGNOSIS — R0600 Dyspnea, unspecified: Secondary | ICD-10-CM | POA: Insufficient documentation

## 2015-06-12 ENCOUNTER — Encounter: Payer: Self-pay | Admitting: Internal Medicine

## 2015-06-16 ENCOUNTER — Other Ambulatory Visit (HOSPITAL_COMMUNITY): Payer: Self-pay | Admitting: *Deleted

## 2015-06-16 DIAGNOSIS — R0600 Dyspnea, unspecified: Secondary | ICD-10-CM | POA: Diagnosis not present

## 2015-06-19 ENCOUNTER — Ambulatory Visit: Payer: BLUE CROSS/BLUE SHIELD | Admitting: Internal Medicine

## 2015-12-06 IMAGING — MG MM DIGITAL SCREENING BILAT W/ CAD
4 series · 4 of 4 positions shown · non-contrast
Comparison: None.

CLINICAL DATA: Screening.

EXAM:
DIGITAL SCREENING BILATERAL MAMMOGRAM WITH CAD

[R CC]
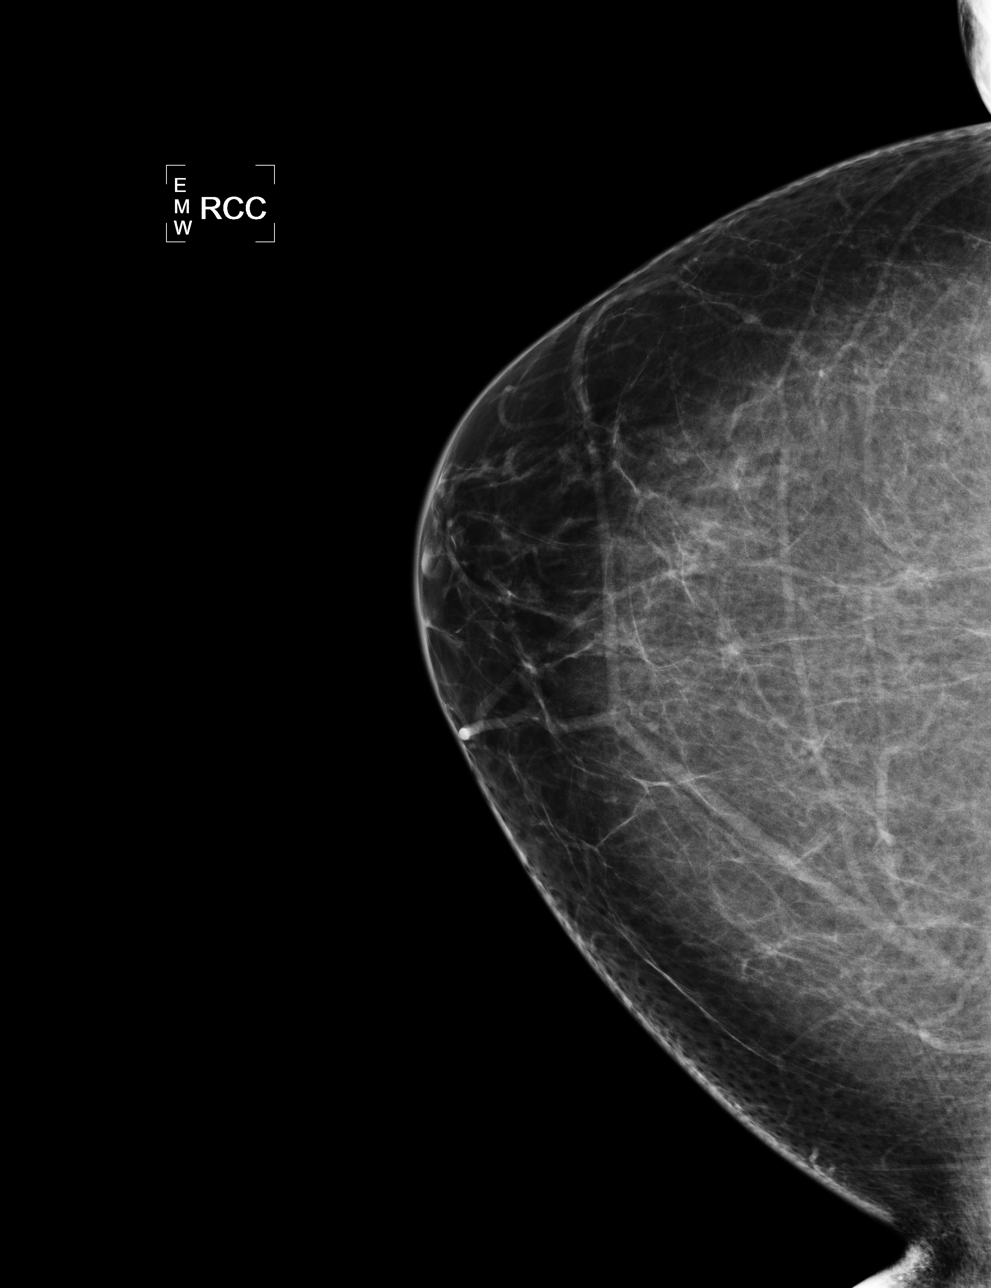

[L CC]
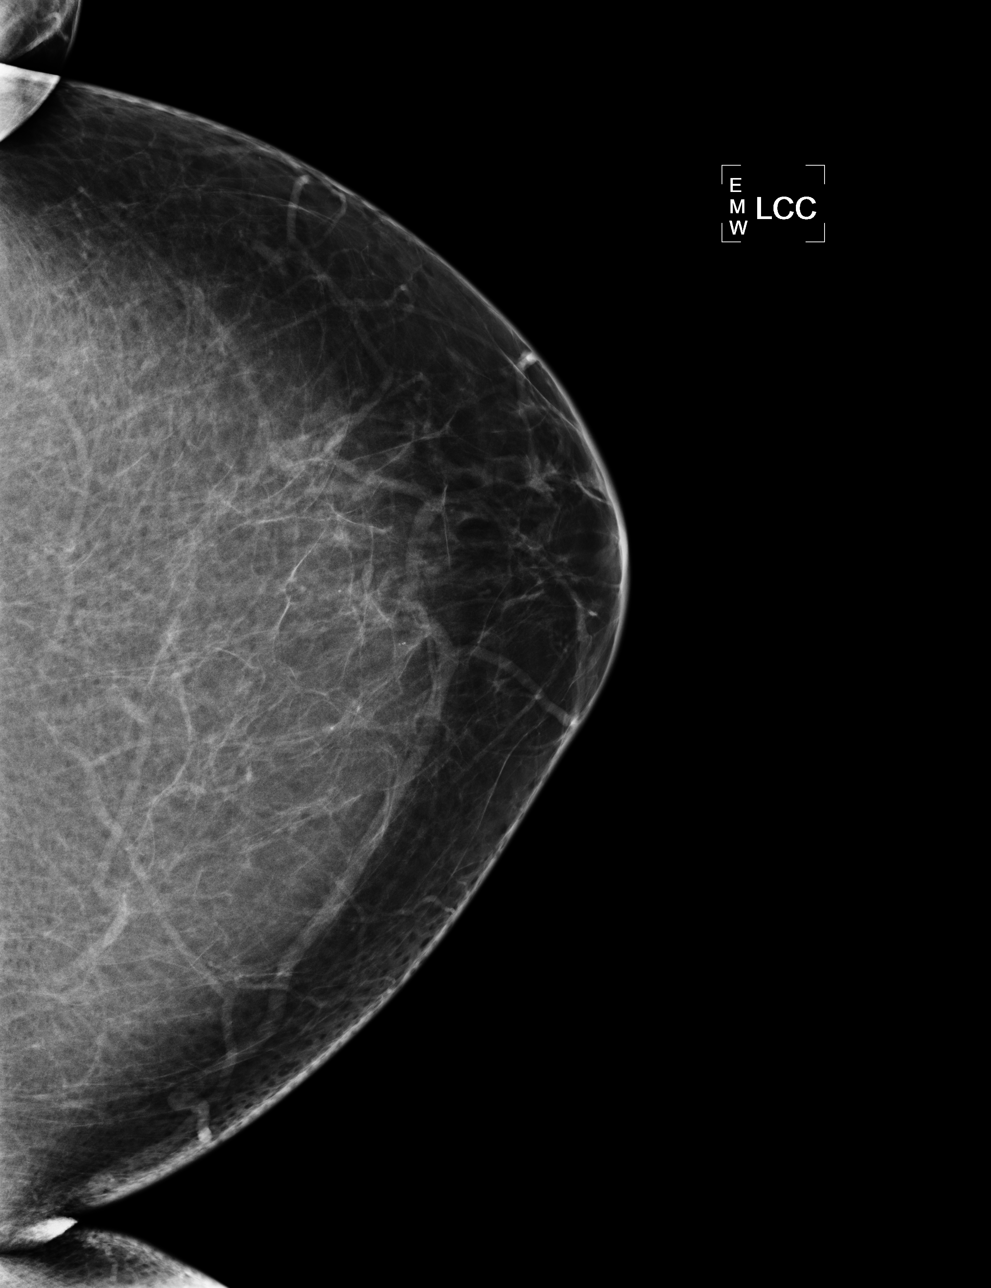

[L MLO]
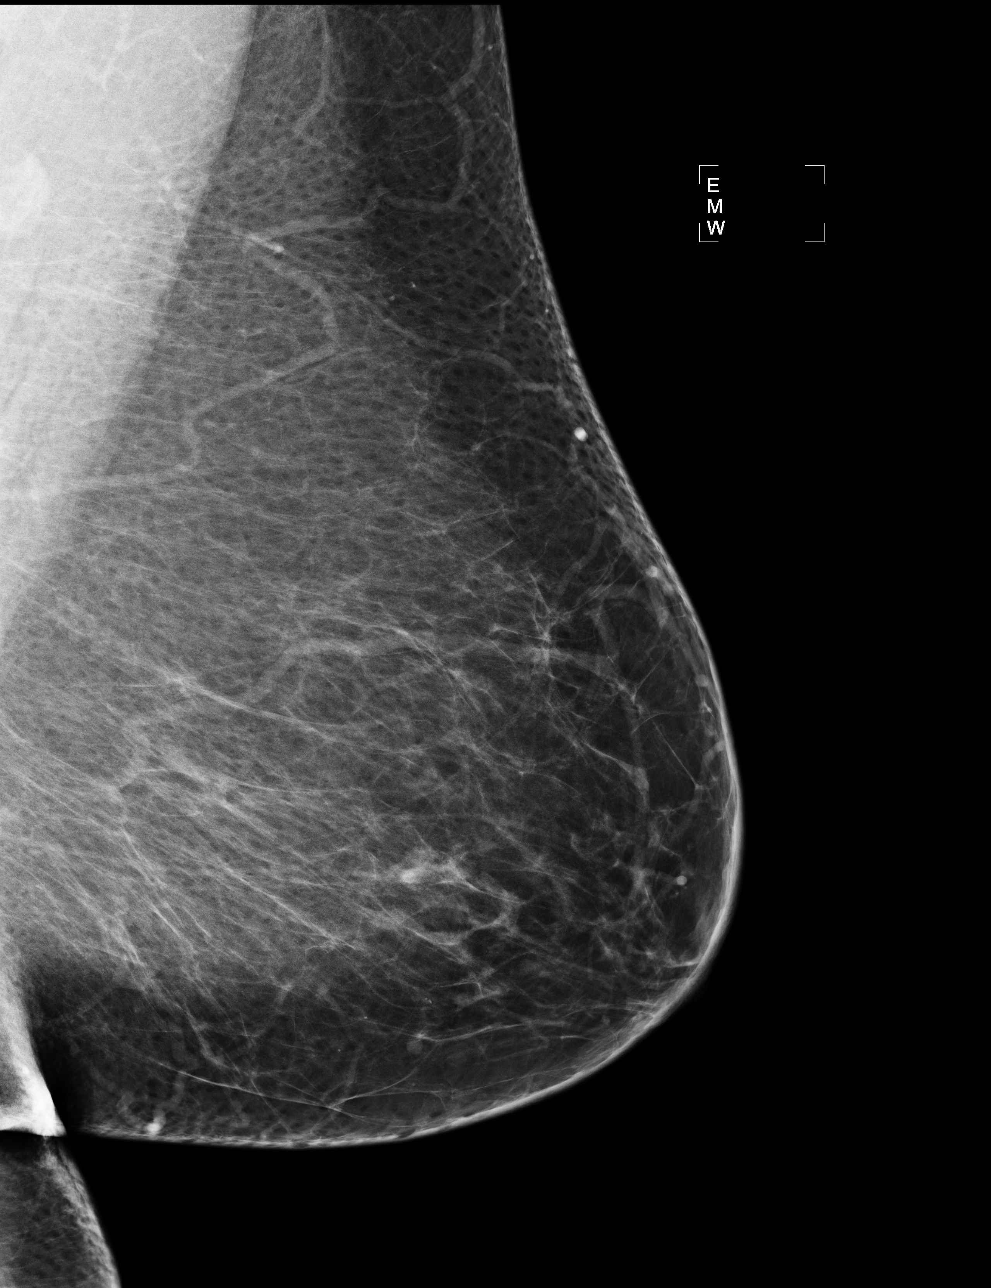

[R MLO]
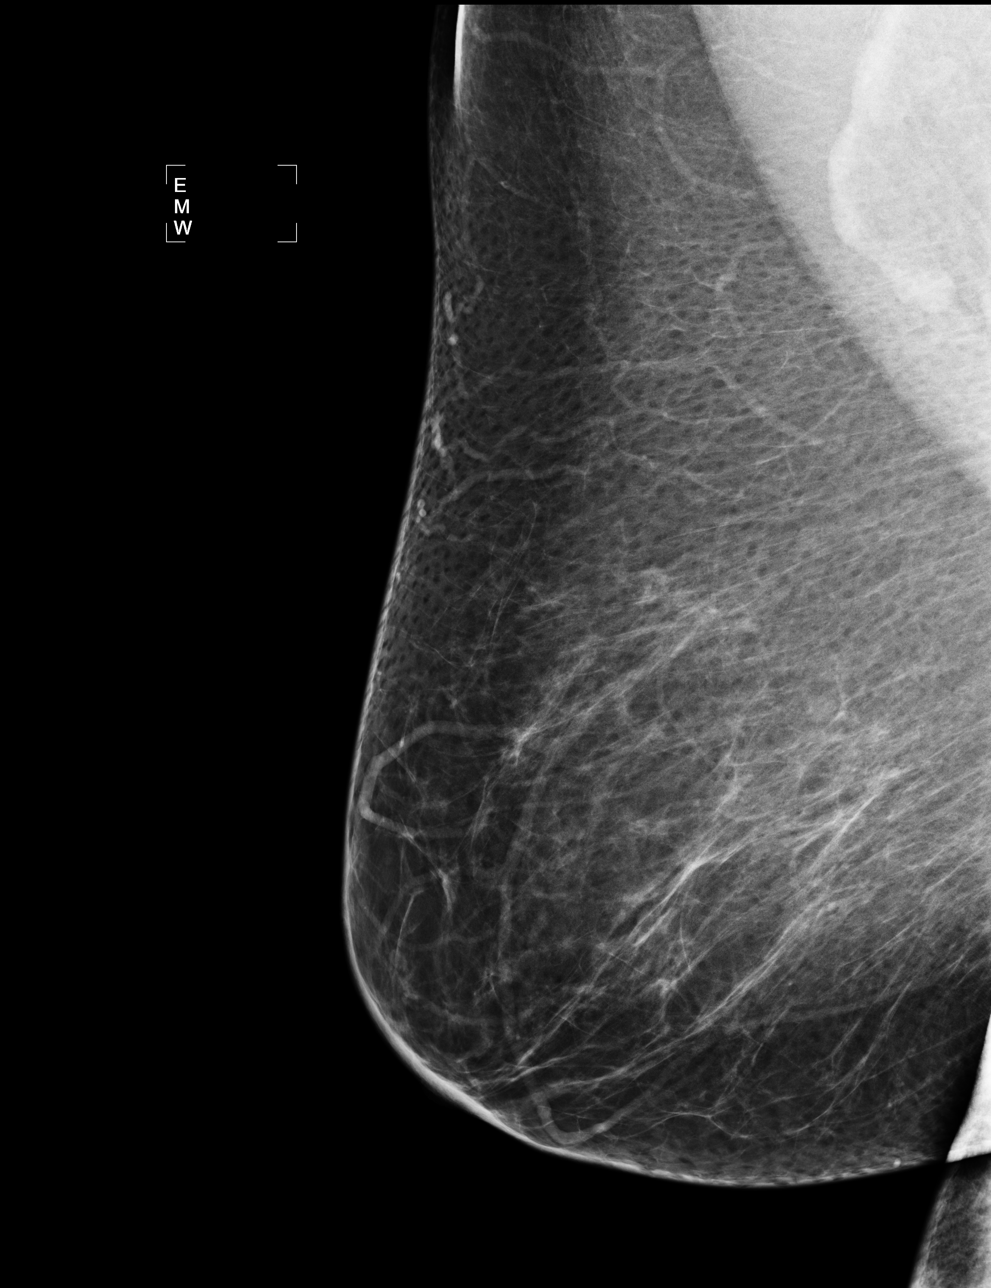

[4 of 4 positions shown; findings below may reference images not displayed]

ACR Breast Density Category b: There are scattered areas of
fibroglandular density.
FINDINGS: There are no findings suspicious for malignancy. Images were
processed with CAD.
IMPRESSION: No mammographic evidence of malignancy. A result letter of this
screening mammogram will be mailed directly to the patient.

RECOMMENDATION:
Screening mammogram in one year. (Code:[id number])

BI-RADS CATEGORY  1: Negative.

## 2016-04-05 ENCOUNTER — Telehealth: Payer: Self-pay | Admitting: Internal Medicine

## 2016-04-05 ENCOUNTER — Other Ambulatory Visit: Payer: Self-pay | Admitting: Internal Medicine

## 2016-04-05 ENCOUNTER — Other Ambulatory Visit (INDEPENDENT_AMBULATORY_CARE_PROVIDER_SITE_OTHER): Payer: BLUE CROSS/BLUE SHIELD

## 2016-04-05 DIAGNOSIS — E039 Hypothyroidism, unspecified: Secondary | ICD-10-CM

## 2016-04-05 LAB — TSH: TSH: 36 u[IU]/mL — AB (ref 0.35–4.50)

## 2016-04-05 MED ORDER — LEVOTHYROXINE SODIUM 150 MCG PO TABS: 150.0000 ug | ORAL_TABLET | Freq: Every day | ORAL | Status: AC

## 2016-04-05 NOTE — Telephone Encounter (Signed)
Awaiting TSH results.

## 2016-04-05 NOTE — Telephone Encounter (Signed)
LMOM informing Pt of TSH results. Informed her I am mailing her a 90 day [place] and to find a new PCP, or go to urgent care for follow-up as soon as possible, and that we will not be able to refill any further medications after this. Instructed her to let us know if she has been taking the Synthroid daily, in that case we may need to adjust her dosage before she can get in w/ a new provider. Informed her to call if questions or concerns.

## 2016-04-05 NOTE — Telephone Encounter (Signed)
Caller name: Bosie ClosJudith Relation to pt: self Call back number: [phone] Pharmacy:   Reason for call: Pt came in office stating is leaving tomorrow 04-06-16 and is needing refill on levothyroxine (SYNTHROID, LEVOTHROID) 150 MCG tablet for 90 days. Pt states since leaving town will need a printed out prescription with out pharmacy since [place] going to [place]. Please advise ASAP.

## 2016-04-05 NOTE — Telephone Encounter (Signed)
TSH is quite elevated, she got a 6 months supply of Synthroid more than a year ago thus suspect poor compliance. Advise patient:  Strongly  recommend to take her medication daily, send a 90 day supply, no further refills from this office, needs to find a local provider or go to a urgent care. If she has been taking the medication every day for the last few months let me know.

## 2016-04-05 NOTE — Telephone Encounter (Signed)
Pt states would like prescription sent to her new address on file: [address], [place], [place] [phone]. Thank you.

## 2016-04-05 NOTE — Telephone Encounter (Signed)
Pt came in today to have Synthroid refilled, [place] and is requesting a 90 day supply until she can find another MD. TSH had not been checked since 03/2015. TSH today is 36.00. Please advise.

## 2016-04-05 NOTE — Telephone Encounter (Signed)
Noted  

## 2016-04-05 NOTE — Telephone Encounter (Signed)
Pt due for TSH, and CPE. TSH orders placed for her to have drawn while here today. Will refill medication once results are back.
